# Patient Record
Sex: Male | Born: 1995 | Race: Black or African American | Hispanic: No | Marital: Single | Smoking: Never smoker
Health system: Southern US, Community
[De-identification: ages and names within clinical notes are randomized; demographics above are authoritative.]

## PROBLEM LIST (undated history)

## (undated) DIAGNOSIS — J45909 Unspecified asthma, uncomplicated: Secondary | ICD-10-CM

---

## 2017-03-04 ENCOUNTER — Emergency Department (HOSPITAL_COMMUNITY): Payer: No Typology Code available for payment source

## 2017-03-04 ENCOUNTER — Encounter (HOSPITAL_COMMUNITY): Payer: Self-pay | Admitting: Emergency Medicine

## 2017-03-04 ENCOUNTER — Emergency Department (HOSPITAL_COMMUNITY)
Admission: EM | Admit: 2017-03-04 | Discharge: 2017-03-04 | Disposition: A | Discharge status: Home / Self Care | Payer: No Typology Code available for payment source | Attending: Emergency Medicine | Admitting: Emergency Medicine

## 2017-03-04 DIAGNOSIS — Y9389 Activity, other specified: Secondary | ICD-10-CM | POA: Insufficient documentation

## 2017-03-04 DIAGNOSIS — J45909 Unspecified asthma, uncomplicated: Secondary | ICD-10-CM | POA: Insufficient documentation

## 2017-03-04 DIAGNOSIS — M25511 Pain in right shoulder: Secondary | ICD-10-CM | POA: Diagnosis present

## 2017-03-04 DIAGNOSIS — Y999 Unspecified external cause status: Secondary | ICD-10-CM | POA: Diagnosis not present

## 2017-03-04 DIAGNOSIS — M62838 Other muscle spasm: Secondary | ICD-10-CM | POA: Insufficient documentation

## 2017-03-04 DIAGNOSIS — Y92410 Unspecified street and highway as the place of occurrence of the external cause: Secondary | ICD-10-CM | POA: Diagnosis not present

## 2017-03-04 HISTORY — DX: Unspecified asthma, uncomplicated: J45.909

## 2017-03-04 MED ORDER — METHOCARBAMOL 500 MG PO TABS: 500.0000 mg | ORAL_TABLET | Freq: Three times a day (TID) | ORAL | 0 refills | Status: AC | PRN
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
## 2017-03-04 NOTE — Discharge Instructions (Signed)
X-ray is negative.  Pain likely from muscle spasm.   Take tylenol 1000 mg every 8 hours (morning, afternoon, evening) and robaxing 500 mg every 8hours for the next 3 days. Heat, light massage and stretching will help loosen up muscle.   Follow up with primary care doctor in 1 week if pain persists.
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
## 2021-01-29 ENCOUNTER — Other Ambulatory Visit: Payer: Self-pay | Admitting: Internal Medicine

## 2021-01-30 LAB — LIPID PANEL
Cholesterol: 158 mg/dL (ref <200)
HDL: 41 mg/dL (ref >=40)
LDL Cholesterol (Calc): 101 mg/dL (calc) — ABNORMAL HIGH
Non-HDL Cholesterol (Calc): 117 mg/dL (calc) (ref <130)
Total CHOL/HDL Ratio: 3.9 (calc) (ref <5.0)
Triglycerides: 71 mg/dL (ref <150)

## 2021-01-30 LAB — COMPLETE METABOLIC PANEL WITH GFR
AG Ratio: 1.4 (calc) (ref 1.0–2.5)
ALT: 37 U/L (ref 9–46)
AST: 31 U/L (ref 10–40)
Albumin: 4.2 g/dL (ref 3.6–5.1)
Alkaline phosphatase (APISO): 49 U/L (ref 36–130)
BUN: 16 mg/dL (ref 7–25)
CO2: 24 mmol/L (ref 20–32)
Calcium: 9.6 mg/dL (ref 8.6–10.3)
Chloride: 107 mmol/L (ref 98–110)
Creat: 1.08 mg/dL (ref 0.60–1.24)
Globulin: 2.9 g/dL (calc) (ref 1.9–3.7)
Glucose, Bld: 113 mg/dL — ABNORMAL HIGH (ref 65–99)
Potassium: 4.1 mmol/L (ref 3.5–5.3)
Sodium: 141 mmol/L (ref 135–146)
Total Bilirubin: 0.6 mg/dL (ref 0.2–1.2)
Total Protein: 7.1 g/dL (ref 6.1–8.1)
eGFR: 98 mL/min/{1.73_m2} (ref >=60)

## 2021-01-30 LAB — CBC
HCT: 43.9 % (ref 38.5–50.0)
Hemoglobin: 14.1 g/dL (ref 13.2–17.1)
MCH: 25.5 pg — ABNORMAL LOW (ref 27.0–33.0)
MCHC: 32.1 g/dL (ref 32.0–36.0)
MCV: 79.4 fL — ABNORMAL LOW (ref 80.0–100.0)
MPV: 12.2 fL (ref 7.5–12.5)
Platelets: 168 10*3/uL (ref 140–400)
RBC: 5.53 10*6/uL (ref 4.20–5.80)
RDW: 14.4 % (ref 11.0–15.0)
WBC: 4.8 10*3/uL (ref 3.8–10.8)

## 2021-01-30 LAB — TSH: TSH: 1.2 mIU/L (ref 0.40–4.50)

## 2021-01-30 LAB — VITAMIN D 25 HYDROXY (VIT D DEFICIENCY, FRACTURES): Vit D, 25-Hydroxy: 14 ng/mL — ABNORMAL LOW (ref 30–100)
